# Patient Record
Sex: Female | Born: 1950 | Race: White | Hispanic: No | Marital: Married | Smoking: Never smoker
Health system: Southern US, Community
[De-identification: ages and names within clinical notes are randomized; demographics above are authoritative.]

## PROBLEM LIST (undated history)

## (undated) HISTORY — PX: APPENDECTOMY: SHX54

## (undated) HISTORY — PX: KNEE ARTHROCENTESIS: SUR44

## (undated) HISTORY — PX: TONSILLECTOMY: SUR1361

---
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
## 1998-09-13 ENCOUNTER — Encounter: Payer: Self-pay | Admitting: Orthopedic Surgery

## 1998-09-13 ENCOUNTER — Ambulatory Visit (HOSPITAL_COMMUNITY): Admission: RE | Admit: 1998-09-13 | Discharge: 1998-09-13 | Payer: Self-pay | Admitting: Orthopedic Surgery

## 2000-01-31 ENCOUNTER — Encounter: Payer: Self-pay | Admitting: Obstetrics and Gynecology

## 2000-01-31 ENCOUNTER — Encounter: Admission: RE | Admit: 2000-01-31 | Discharge: 2000-01-31 | Payer: Self-pay | Admitting: Obstetrics and Gynecology

## 2000-10-29 ENCOUNTER — Other Ambulatory Visit: Admission: RE | Admit: 2000-10-29 | Discharge: 2000-10-29 | Payer: Self-pay | Admitting: Obstetrics and Gynecology

## 2001-02-27 ENCOUNTER — Encounter: Payer: Self-pay | Admitting: Cardiovascular Disease

## 2001-02-27 ENCOUNTER — Inpatient Hospital Stay (HOSPITAL_COMMUNITY): Admission: EM | Admit: 2001-02-27 | Discharge: 2001-02-28 | Payer: Self-pay | Admitting: Emergency Medicine

## 2001-02-28 ENCOUNTER — Encounter: Payer: Self-pay | Admitting: Cardiovascular Disease

## 2001-03-05 ENCOUNTER — Encounter: Payer: Self-pay | Admitting: Family Medicine

## 2001-03-05 ENCOUNTER — Encounter: Admission: RE | Admit: 2001-03-05 | Discharge: 2001-03-05 | Payer: Self-pay | Admitting: Family Medicine

## 2001-03-12 ENCOUNTER — Encounter: Admission: RE | Admit: 2001-03-12 | Discharge: 2001-03-12 | Payer: Self-pay | Admitting: Urology

## 2001-03-12 ENCOUNTER — Encounter: Payer: Self-pay | Admitting: Urology

## 2001-03-19 ENCOUNTER — Encounter: Payer: Self-pay | Admitting: Family Medicine

## 2001-03-19 ENCOUNTER — Encounter: Admission: RE | Admit: 2001-03-19 | Discharge: 2001-03-19 | Payer: Self-pay | Admitting: Family Medicine

## 2001-04-23 ENCOUNTER — Ambulatory Visit (HOSPITAL_COMMUNITY): Admission: RE | Admit: 2001-04-23 | Discharge: 2001-04-23 | Payer: Self-pay | Admitting: Gastroenterology

## 2001-04-23 ENCOUNTER — Encounter (INDEPENDENT_AMBULATORY_CARE_PROVIDER_SITE_OTHER): Payer: Self-pay | Admitting: Specialist

## 2001-09-11 ENCOUNTER — Ambulatory Visit (HOSPITAL_COMMUNITY): Admission: RE | Admit: 2001-09-11 | Discharge: 2001-09-11 | Payer: Self-pay | Admitting: Family Medicine

## 2001-09-11 ENCOUNTER — Encounter: Payer: Self-pay | Admitting: Family Medicine

## 2002-01-20 ENCOUNTER — Encounter: Admission: RE | Admit: 2002-01-20 | Discharge: 2002-01-20 | Payer: Self-pay | Admitting: Sports Medicine

## 2002-02-10 ENCOUNTER — Encounter: Admission: RE | Admit: 2002-02-10 | Discharge: 2002-02-10 | Payer: Self-pay | Admitting: Sports Medicine

## 2002-02-10 ENCOUNTER — Encounter: Payer: Self-pay | Admitting: Sports Medicine

## 2002-02-17 ENCOUNTER — Encounter: Admission: RE | Admit: 2002-02-17 | Discharge: 2002-02-17 | Payer: Self-pay | Admitting: Family Medicine

## 2002-03-27 ENCOUNTER — Encounter: Admission: RE | Admit: 2002-03-27 | Discharge: 2002-03-27 | Payer: Self-pay | Admitting: Family Medicine

## 2002-03-31 ENCOUNTER — Encounter: Admission: RE | Admit: 2002-03-31 | Discharge: 2002-03-31 | Payer: Self-pay | Admitting: Sports Medicine

## 2002-04-21 ENCOUNTER — Encounter: Admission: RE | Admit: 2002-04-21 | Discharge: 2002-04-21 | Payer: Self-pay | Admitting: Sports Medicine

## 2002-08-04 ENCOUNTER — Encounter: Admission: RE | Admit: 2002-08-04 | Discharge: 2002-08-04 | Payer: Self-pay | Admitting: Sports Medicine

## 2003-06-06 ENCOUNTER — Encounter: Payer: Self-pay | Admitting: Rheumatology

## 2003-06-06 ENCOUNTER — Encounter: Admission: RE | Admit: 2003-06-06 | Discharge: 2003-06-06 | Payer: Self-pay | Admitting: Rheumatology

## 2005-03-28 ENCOUNTER — Ambulatory Visit: Payer: Self-pay

## 2005-05-13 ENCOUNTER — Inpatient Hospital Stay (HOSPITAL_COMMUNITY): Admission: EM | Admit: 2005-05-13 | Discharge: 2005-05-15 | Payer: Self-pay | Admitting: Emergency Medicine

## 2005-07-11 ENCOUNTER — Ambulatory Visit: Payer: Self-pay

## 2007-11-24 ENCOUNTER — Ambulatory Visit (HOSPITAL_COMMUNITY): Admission: RE | Admit: 2007-11-24 | Discharge: 2007-11-24 | Payer: Self-pay | Admitting: Endocrinology

## 2007-12-27 ENCOUNTER — Emergency Department (HOSPITAL_COMMUNITY): Admission: EM | Admit: 2007-12-27 | Discharge: 2007-12-27 | Payer: Self-pay | Admitting: Emergency Medicine

## 2008-04-07 ENCOUNTER — Ambulatory Visit (HOSPITAL_BASED_OUTPATIENT_CLINIC_OR_DEPARTMENT_OTHER): Admission: RE | Admit: 2008-04-07 | Discharge: 2008-04-07 | Payer: Self-pay | Admitting: Family Medicine

## 2008-04-17 ENCOUNTER — Ambulatory Visit: Payer: Self-pay | Admitting: Internal Medicine

## 2009-08-26 ENCOUNTER — Encounter: Admission: RE | Admit: 2009-08-26 | Discharge: 2009-08-26 | Payer: Self-pay | Admitting: Orthopedic Surgery

## 2009-09-07 ENCOUNTER — Encounter: Admission: RE | Admit: 2009-09-07 | Discharge: 2009-09-07 | Payer: Self-pay | Admitting: Orthopedic Surgery

## 2009-10-04 ENCOUNTER — Encounter: Payer: Self-pay | Admitting: Orthopedic Surgery

## 2009-10-09 ENCOUNTER — Emergency Department (HOSPITAL_COMMUNITY): Admission: EM | Admit: 2009-10-09 | Discharge: 2009-10-09 | Payer: Self-pay | Admitting: Emergency Medicine

## 2010-01-12 ENCOUNTER — Encounter: Admission: RE | Admit: 2010-01-12 | Discharge: 2010-01-12 | Payer: Self-pay | Admitting: Orthopedic Surgery

## 2010-01-13 ENCOUNTER — Encounter: Admission: RE | Admit: 2010-01-13 | Discharge: 2010-01-13 | Payer: Self-pay | Admitting: Orthopedic Surgery

## 2010-07-07 ENCOUNTER — Emergency Department (HOSPITAL_COMMUNITY): Admission: EM | Admit: 2010-07-07 | Discharge: 2010-07-07 | Payer: Self-pay | Admitting: Emergency Medicine

## 2010-07-17 ENCOUNTER — Ambulatory Visit (HOSPITAL_COMMUNITY): Admission: RE | Admit: 2010-07-17 | Discharge: 2010-07-17 | Payer: Self-pay | Admitting: Family Medicine

## 2010-12-10 ENCOUNTER — Encounter: Payer: Self-pay | Admitting: Orthopedic Surgery

## 2011-02-02 LAB — URINE MICROSCOPIC-ADD ON

## 2011-02-02 LAB — DIFFERENTIAL
Basophils Absolute: 0 10*3/uL (ref 0.0–0.1)
Basophils Relative: 0 % (ref 0–1)
Eosinophils Absolute: 0.1 10*3/uL (ref 0.0–0.7)
Eosinophils Relative: 1 % (ref 0–5)
Lymphocytes Relative: 20 % (ref 12–46)
Lymphs Abs: 2.5 10*3/uL (ref 0.7–4.0)
Neutro Abs: 9.1 10*3/uL — ABNORMAL HIGH (ref 1.7–7.7)

## 2011-02-02 LAB — CULTURE, BLOOD (ROUTINE X 2)

## 2011-02-02 LAB — CBC
HCT: 35.3 % — ABNORMAL LOW (ref 36.0–46.0)
Hemoglobin: 12.2 g/dL (ref 12.0–15.0)
MCHC: 34.5 g/dL (ref 30.0–36.0)
MCV: 87.4 fL (ref 78.0–100.0)
Platelets: 280 10*3/uL (ref 150–400)
RDW: 14 % (ref 11.5–15.5)
WBC: 12.4 10*3/uL — ABNORMAL HIGH (ref 4.0–10.5)

## 2011-02-02 LAB — URINE CULTURE: Culture  Setup Time: 201108191605

## 2011-02-02 LAB — BASIC METABOLIC PANEL: Creatinine, Ser: 1 mg/dL (ref 0.4–1.2)

## 2011-02-02 LAB — URINALYSIS, ROUTINE W REFLEX MICROSCOPIC
Glucose, UA: NEGATIVE mg/dL
Hgb urine dipstick: NEGATIVE
Nitrite: NEGATIVE
pH: 7 (ref 5.0–8.0)

## 2011-02-02 LAB — LACTIC ACID, PLASMA: Lactic Acid, Venous: 1.5 mmol/L (ref 0.5–2.2)

## 2011-02-21 LAB — URINALYSIS, ROUTINE W REFLEX MICROSCOPIC
Bilirubin Urine: NEGATIVE
Glucose, UA: NEGATIVE mg/dL
Hgb urine dipstick: NEGATIVE
Ketones, ur: NEGATIVE mg/dL
Protein, ur: NEGATIVE mg/dL

## 2011-02-21 LAB — BASIC METABOLIC PANEL
BUN: 31 mg/dL — ABNORMAL HIGH (ref 6–23)
GFR calc Af Amer: >60 mL/min (ref >60)

## 2011-02-21 LAB — SODIUM, URINE, RANDOM: Sodium, Ur: 29 mEq/L

## 2011-02-21 LAB — PHOSPHORUS: Phosphorus: 5 mg/dL — ABNORMAL HIGH (ref 2.3–4.6)

## 2011-04-03 NOTE — Procedures (Signed)
NAME:  [name], FENN                 ACCOUNT NO.:  [id number]   MEDICAL RECORD NO.:  [id number]          PATIENT TYPE:  OUT   LOCATION:  SLEEP CENTER                 FACILITY:  [facility]   PHYSICIAN:  [name], MD, FCCP, FACPDATE OF BIRTH:  Apr 24[birth year] 1953   DATE OF STUDY:  04/07/2008                            NOCTURNAL POLYSOMNOGRAM   REFERRING PHYSICIAN:   REFERRING PHYSICIAN:  Gloriajean Dell. Andrey Campanile, M.D.   INDICATION FOR STUDY:  Insomnia with sleep apnea.   EPWORTH SLEEPINESS SCORE:  7/24.  BMI 29.8.  Weight 190 pounds.  Height  67 inches.  Neck 14-1/2 inches.   HOME MEDICATIONS:  Charted and reviewed.   MEDICAL HISTORY:  Hypopituitarism noted.   SLEEP ARCHITECTURE:  Total sleep time 341 minutes with sleep efficiency  90.7%.  Stage 1 was 1.8%.  Stage 2 88.9%.  Stage 3 absent.  REM 9.4% of  total sleep time.  Sleep latency 24 minutes.  REM latency 230 minutes.  Awake after sleep onset 11.5 minutes.  Arousal index 7.6.  Bedtime  medication included Neurontin, diazepam, Vicodin, OxyContin, Lunesta and  promethazine, all taken at 10 p.m.   RESPIRATORY DATA:  Apnea hypopnea index (AHI) 5.1 per hour indicating  mild sleep apnea syndrome.  There were 29 scored events including 9  central apneas and 20 hypopneas.  All events were recorded while  nonsupine with REM/AHI 28.1.  There were insufficient events to permit  CPAP titration by split protocol on this study night.   OXYGEN DATA:  Moderately loud snoring with oxygen desaturation to a  nadir of 83%.  Mean oxygen saturation through the study was 93.2% on  room air.   CARDIAC DATA:  Normal sinus rhythm.   MOVEMENT-PARASOMNIA:  No significant movement disturbance.  No bathroom  trips.   IMPRESSIONS-RECOMMENDATIONS:  1. Sleep architecture reflected heavy bedtime medication as noted      above, including multiple narcotics and sleep medications.  2. Mild sleep apnea, mainly central events at hypopneas, AHI 5.1 per      hour with most  events while nonsupine or in REM.  Moderately loud      snoring without with oxygen desaturation to a nadir of 83%.  A      total of 0.5 minutes was spent with oxygen saturation less than 88%      on this study night.  3. CPAP would ordinarily not be considered a therapeutic option for      this pattern.  Consider if the amount of sedating medication at      bedtime can be reduced noting that this patient did sleep through      the night.      [name], MD, [facility], FACP  Diplomate, Biomedical engineer of Sleep Medicine  Electronically Signed     CDY/MEDQ  D:  04/17/2008 12:15:11  T:  04/17/2008 12:51:58  Job:  [id number]

## 2011-04-06 NOTE — Discharge Summary (Signed)
[name], [name]                 ACCOUNT NO.:  [id number]   MEDICAL RECORD NO.:  [id number]          PATIENT TYPE:  INP   LOCATION:  1618                         FACILITY:  [facility]   PHYSICIAN:  Danae Chen, M.D.DATE OF BIRTH:  02-25-1951   DATE OF ADMISSION:  05/13/2005  DATE OF DISCHARGE:  05/15/2005                                 DISCHARGE SUMMARY   DISCHARGE DIAGNOSES:  1.  Nausea, vomiting, abdominal pain secondary to probable viral      gastroenteritis.  2.  Chronic pain syndrome.  3.  History of ADHD.  4.  History of seasonal allergies.   DISCHARGE MEDICATIONS:  The patient is to resume her home medications at  prior dosages.   1.  OxyContin 20 mg p.o. b.i.d.  2.  Ultram 50 mg, 1-2 p.o. q.4-6 h p.r.n.  3.  Lyrica 150 mg p.o. q.h.s.  4.  Cymbalta 30 mg p.o. q.h.s.  5.  Adderall 20 mg, 1-2 p.o. q.6 h p.r.n.  6.  Vivelle 0.5 mg 2 patches per week.  7.  Valium 5 mg p.o. t.i.d. p.r.n.  8.  Zyrtec 10 mg p.o. daily p.r.n.  9.  __________ 0.25 mg q.h.s.  10. Phenergan 25 mg, 1 p.o. q.4-6 h p.r.n.   FOLLOW UP:  Dr. Benedetto Goad, [facility], in 1-2 weeks. The  patient will call for appointment.   CONSULTATIONS:  None.   PROCEDURE:  None.   BRIEF HOSPITAL COURSE:  The patient is a 60 year old white female who was  admitted with nausea, vomiting, and abdominal pain. She underwent  evaluation, KUB was normal. Lab workups included a C Dif toxin which was  also negative. She had no electrolyte abnormalities. By hospital day #2, she  was eating and drinking well without complaints of abdominal pain. She  remained afebrile throughout her hospital stay. She did require some  antiemetics and her home medications while in the hospital but no flareup of  her abdominal pain at the time of discharge.   PHYSICAL EXAMINATION:  VITAL SIGNS:  Temperature 97.8, blood pressure  102/64, pulse 62, 98% saturations on room air.  ABDOMEN:  Soft, nontender, no rebound,  no guarding.  LUNGS:  Clear.  HEART:  Regular rate.  EXTREMITIES:  No peripheral edema.  GENERAL:  Alert and oriented.   LABORATORY DATA:  Pertinent discharge labs as noted. BNP within normal  limits, KUB that showed nothing acute. Hemoglobin A1c was 6.2 and was  checked because of her elevated CBG that may have been related to stress.  Hemoglobin is 14.5. Sodium 142, potassium 3.4, chloride 113, glucose 107,  BUN 8, creatinine 0.9. Lipase was normal at 22.   The patient will be given one dose of K-Dur 40 mEq prior to discharge.       RLK/MEDQ  D:  05/15/2005  T:  05/15/2005  Job:  [id number]

## 2011-04-06 NOTE — Consult Note (Signed)
[facility]  Patient:    [name], [name]                        MRN: [id number] Proc. Date: 02/27/01 Adm. Date:  [id number] Attending:  Ruta Hinds CC:         Richard A. Alanda Amass, M.D.  Carolyne Fiscal, M.D.   Consultation Report  REASON FOR CONSULTATION:  Abdominal pain.  HISTORY OF PRESENT ILLNESS:  For the past two months [name] has had mild fleeting right lower quadrant abdominal pain. Six days ago this developed rather acutely while at work. 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 She has been pretty much in bed for the past week with her persistent right lower quadrant pain. It is described as sharp, intense, burning, searing in nature. Reminiscent of a remote history of endometriosis for which TAH/BSO was performed in 1995. Symptoms have gradually progressed to the point where she was admitted to the hospital via the emergency room tonight.  At the onset she was seen by Dr. Duaine Dredge who prescribed Flagyl and Cipro. She took this for less than 48 hours because of subsequent perioral paresthesias. Antibiotics did offer some relief but symptoms rapidly recurred and worsened once these were discontinued. Despite this, she denies fever. No rigor, chill or diaphoresis. No prior history of diverticular disease.  Her appetite has been rather poor due to pain. She denies nausea or vomiting. No abdominal distention. No increased flatus or belching. Bowel habits have remained regular. There is no correlation of symptoms with defecation. No correlation with urinary function. The patient denies hematemesis, melena or hematochezia.  The patient was evaluated by Dr. Eliberto Ivory. Dickstein today with a normal pelvic exam. Referred to the emergency room for surgical consultation.  PAST MEDICAL/SURGICAL HISTORY: 1. Campylobacter colitis, Salmonella colitis. 2. Status post  TAH/BSO. 3. Status post appendectomy. 4. Colonoscopy in 1995 performed by Dr. Lina Sar because of patients    mothers history of colon cancer. Endometriosis diagnosed at that    time. 5. Degenerative arthritis of both knees, right greater than left. 6. Multiple prior surgeries. 7. Post surgical neuropathy.  CURRENT MEDICATIONS: 1. Neurontin. 2. Ultram. 3. Adderall. 4. Vivelle-Dot.  ALLERGIES:  No known drug allergies.  FAMILY HISTORY:  Mother with history of colon cancer.  SOCIAL HISTORY:  The patient is married. She works as an Neurosurgeon, Therapist, occupational.  PHYSICAL EXAMINATION:  GENERAL:  Acutely ill-appearing, anxious, white female, middle aged, alert and oriented x 3.  VITAL SIGNS:  Vital signs stable, afebrile.  HEENT:  EOMI. PERRL. Anicteric sclerae. Pink conjunctivae. No pallor. Mouth - dry mucosa, otherwise without lesions of lips, teeth or gums.  NECK:  Supple, no adenopathy, thyromegaly or bruit.  LUNGS:  Chest clear to auscultation.  CARDIOVASCULAR:  Regular rhythm, no gallop, no murmur.  ABDOMEN:  Soft, nondistended, tender right lower quadrant with localized peritoneal signs just above the right inguinal ligament. No fullness, no mass, no rebound. Voluntary guarding present. No organomegaly.  RECTAL:  Not performed due to patients location in the ER hallway.  EXTREMITIES:  No cyanosis, clubbing, or edema.  SKIN:  No lesions.  LABORATORY AND ACCESSORY DATA:  Normal CBC, CMET and urinalysis.  Radiographs:  CT scan unenhanced, normal.  ASSESSMENT: 1. Right lower quadrant pain. Impressive to physical examination    but without other objective findings. No fever, leukocytosis or  other evidence for an acute infection. Appendicitis does not have    to concern [place] given her prior history of appendectomy. Similarly,    gynecologic pathology is not a concern here. I suspect the usual    diagnosis such as infarcted epiploic appendix, mesenteric  adenitis.    Doubt primary colopathy. Doubt significant gynecologic pathology.    The patient does not have a surgical abdomen presently. 2. Left costovertebral angle tenderness, uncertain etiology. I am    not sure how this relates to the patients right lower quadrant pain. 3. Increased colorectal neoplasia risk. Significant family history with    first degree relative, e.g., mother with colon cancer.  RECOMMENDATIONS: 1. Perform abdominal/pelvic CT following addition of IV and oral    contrast. 2. Monitor stool Hemoccults. 3. Follow physical examination closely for suspect, laparoscopy    may be indicated if symptoms continue without clear diagnosis. 4. I will follow the patient along with Dr. Pearletha Furl. Alanda Amass. DD:  02/27/01 TD:  02/28/01 Job: [id number]

## 2011-04-06 NOTE — Discharge Summary (Signed)
[facility]  Patient:    [name], [name]                        MRN: [id number] Adm. Date:  [id number] Disc. Date: [id number] Attending:  Ruta Hinds Dictator:   Mancel Bale, P.A. CC:         Griffith Citron, M.D.  Carolyne Fiscal, M.D.   Discharge Summary  ADMISSION DIAGNOSES: 1. History of total abdominal hysterectomy and bilateral    salpingo-oophorectomy. 2. History of spastic colon. 3. Right lower quadrant pain of questionable etiology. 4. History of bilateral staged knee surgeries in 1988 and 1999. 5. Remote appendectomy. 6. Irritable bowel syndrome. 7. Mitral valve prolapse.  DISCHARGE DIAGNOSES: 1. History of total abdominal hysterectomy and bilateral    salpingo-oophorectomy. 2. History of spastic colon. 3. Right lower quadrant pain of questionable etiology. 4. History of bilateral staged knee surgeries in 1988 and 1999. 5. Remote appendectomy. 6. Irritable bowel syndrome. 7. Mitral valve prolapse. 8. Status post gastrointestinal evaluation by Griffith Citron, M.D. 9. Status post general surgery consultation by Abigail Miyamoto, M.D., with no    indication to explore groin or abdomen.  HISTORY OF PRESENT ILLNESS:  [name] is a 60 year old white female who was in her general state of health until approximately six days prior to admission, at which time she developed crampy bilateral lower quadrant pain. She had no nausea, vomiting, or fever.  She was seen as an outpatient in primary and was given Flagyl and Cipro.  She developed paresthesias of the lips and stopped her medications.  She had an increased amount of discomfort on Monday and had intermittent right lower quadrant pain since then.  It was worsened on the day of admission and she saw Dr. Cordelia Pen A. Dicksteins P.A. and apparently had a negative pelvic exam.  She was sent to the emergency room to have surgical consultation.  She was felt to be  dehydrated and nauseous, but had no emesis.  She had no fever or chills.  She did have left CVA tenderness.  She was seen and evaluated by Richard A. Alanda Amass, M.D., at that point.  The etiology of her liver and left flank pain was unclear.  Labs were pending at that point.  He planned for GI evaluation and possible surgical consultation.  As well, abdominal CT and labs were ordered.  HOSPITAL COURSE:  On February 28, 2001, she was seen in GI consultation by Griffith Citron, M.D.  At that time, he recommended CT, check stool guaiacs, and follow exam.  It was felt that she may warrant laparoscopy if symptoms continued.  He planned to follow.  Also, on February 27, 2001, she was seen in general surgery consultation by Abigail Miyamoto, M.D.  The exam did not indicate severe intra-abdominal process.  He was awaiting the results of labs and CT and planned to follow. He planned serial abdominal exams and rehydration.  On February 28, 2001, she was seen again by Abigail Miyamoto, M.D., from general surgery.  CT of the abdomen and pelvis with and without contrast showed tiny left renal stone and ______ of kidney.  No ureteral obstruction or hydronephrosis.  No pyelonephritis.  There was questionable stranding around the right kidney on noncontrast CT, now negative on contrast CT.  There was no hernia, free fluid, inflammatory process, obstruction, or hernia.  No free air.  He felt that she had abdominal pain of uncertain  etiology.  Laboratories were normal and CT showed no significant findings.  He felt there was no indication to explore the groin or abdomen at this point.  He planned to continue to follow.  On February 28, 2001, she remained stable and all laboratories and tests remained negative.  She was deemed stable for discharge home at that time.  At that point, she was afebrile at 97.1 degrees, pulse 65, blood pressure 98/50, and oxygen saturation 99% on room air.  Blood cultures showed no  growth.  The TSH was normal, urinalysis normal, and amylase normal.  She was seen by Gerlene Burdock A. Alanda Amass, M.D., and was deemed stable for discharge home.  CONSULTS: 1. GI consultation by Griffith Citron, M.D., on February 28, 2001, for    evaluation of abdominal pain. 2. General surgery consultation on February 27, 2001, by Abigail Miyamoto, M.D.,    for evaluation of lower abdominal pain.  HOSPITAL PROCEDURES:  None.  RADIOLOGY:  CT on February 28, 2001, of the abdomen and pelvis showed left renal calculi without findings of obstruction.  Question slight haziness of fat planes in the region of the Morrisons pouch.  CT of the pelvis showed no evidence of abnormal inflammatory process in pelvis and small lucency of right ileum of questionable significance.  CT of the pelvis showed no evidence of obstruction and no evidence of acute abnormality.  CT of the abdomen with contrast showed ______ density at left lung base, question atelectasis. Stability can be concluded on follow-up exam.  Left renal calculi without evidence of obstruction.  The region of Morrisons pouch appeared clear. Gallbladder prominent in size may be related to fat.  The patient had not eaten recently.  If gallstones were of clinical concern, ultrasound will be planned.  EKG showed sinus bradycardia at 56 beats per minute and nonspecific ST-T change.  LABORATORY DATA:  On February 27, 2001, WBC 5.9, hemoglobin 12.2, hematocrit 35.9, and platelets 325.  ESR 3.  Sodium 138, potassium 3.4, glucose 95, BUN 8, creatinine 0.9, AST 19, ALT 16, ALP 29, total bilirubin 1.1, direct bilirubin 0.1, indirect bilirubin 1.0, amylase 40. TSH 1.889.  Amylase 71.  The urinalysis was negative.  Blood cultures negative.  ANA negative.  DISCHARGE MEDICATIONS:  She was discharged home on her previous medications with no changes.  FOLLOW-UP:  She is to follow up with Griffith Citron, M.D., and Pearletha Furl.  Alanda Amass, M.D., as instructed.   She is to follow up with them as scheduled routine for office visits. DD:  03/31/01 TD:  04/01/01 Job: [id number]

## 2011-04-06 NOTE — H&P (Signed)
[name], PINCOCK                 ACCOUNT NO.:  [id number]   MEDICAL RECORD NO.:  [id number]          PATIENT TYPE:  EMS   LOCATION:  ED                           FACILITY:  [facility]   PHYSICIAN:  Danae Chen, M.D.DATE OF BIRTH:  10/15/1951   DATE OF ADMISSION:  05/13/2005  DATE OF DISCHARGE:                                HISTORY & PHYSICAL   PRIMARY CARE PHYSICIAN:  Dr. Duffy Rhody C. Wilson.   CHIEF COMPLAINT:  Nausea, vomiting and diarrhea.   HISTORY OF PRESENT ILLNESS:  The patient is a 60 year old white female with  a history of chronic pain syndrome with anxiety who presents with a 3-day  history of upset stomach, nausea, vomiting and diarrhea.  She said that her  family has left for [facility] on a trip and she was too weak to take care of  herself, so she was brought to the emergency room for further evaluation.  She denies any bloody stool.  No recent travel.  No foods.  No unusual foods  that she is aware of that she has eaten.  She denies any fever.  No  headache, no chest pain, no shortness of breath.  She has had no ill  contacts, either in the home or outside the home with similar symptoms.  She  has not been on any recent antibiotics that she is aware of.   PAST MEDICAL HISTORY:  Significant for chronic pain syndrome, constipation,  irritable bowel, depression, anxiety, ADHD.   PAST SURGICAL HISTORY:  Status post hysterectomy and appendectomy, knee  surgery.   MEDICATIONS:  1.  Oxycodone.  2.  Zyrtec.  3.  Ambien.  4.  Diazepam.  5.  Ultram.  6.  Adderall.  7.  Colace.   She has no drug allergies she is aware of.   REVIEW OF SYSTEMS:  Per the history of present illness.   SOCIAL HISTORY:  Denies any tobacco, alcohol or drug use.  She lives with  family and children.  She does not work outside the home.   FAMILY HISTORY:  Negative for colon cancer, hypertension, diabetes.   PHYSICAL EXAMINATION:  VITAL SIGNS:  Temperature is 98.8, blood pressure  140/80, pulse 81.  O2 saturation is 97% on room air.  HEENT:  The oropharynx is clear.  Buccal mucosal membranes are moist.  Head  is atraumatic, normocephalic.  Pupils are equal and reactive.  NECK:  Supple.  No lymphadenopathy.  No JVD.  LUNGS:  Clear to auscultation bilaterally.  HEART:  Rate is regular with a normal S1 and S2.  No murmurs are  appreciated.  ABDOMEN:  Soft, nontender.  No rebound, no guarding, no fluid wave.  No  suprapubic tenderness.  No costovertebral angle tenderness.  EXTREMITIES:  Two plus femoral pulses.  Two plus dorsalis pedis pulses.  She  has no peripheral edema.  Extremities are warm.  NEUROLOGIC:  A nonfocal neurologic exam.  Moves all four extremities.  She  is alert and oriented.   PERTINENT LABS:  White count 8.6 thousand, hemoglobin 14.5, platelets 373.  Sodium 137, potassium 3.5, chloride  107, CO2 22, glucose 157, BUN 15,  creatinine 1.  Calcium 9.6.  Albumin is 4.4.  Liver function tests are  within normal limits.  Lipase is normal at 22.   The patient has received Zofran in the ED.  She has stool for C. difficile,  stool culture and stool O&P that are on order here in the ED.  She has also  had two Lomotil as well as Protonix here in the ED.   IMPRESSION:  A 60 year old white female with chronic pain syndrome who now  presents with nausea, vomiting, diarrhea, intractable to outpatient therapy  for about 3 days' duration with no ill contacts.  The patient presents with  no electrolyte abnormalities, does not appear to be dehydrated, has no focal  physical symptoms.  The differential diagnosis may include viral  gastroenteritis with or without stomach flu and/or reaction to her chronic  pain medications.   At this time we will admit her for observation, IV fluid hydration and will  follow up with her lab results that have been previously ordered.  We will  check a KUB as well.  If further workup does not reveal a specific source of  an  infectious etiology, this may be functional related and/or related to her  medications.  Further disposition is dependent on current evaluation.       RLK/MEDQ  D:  05/13/2005  T:  05/13/2005  Job:  [id number]

## 2011-08-10 LAB — COMPREHENSIVE METABOLIC PANEL
ALT: 57 — ABNORMAL HIGH
AST: 41 — ABNORMAL HIGH
Alkaline Phosphatase: 97
CO2: 32
Chloride: 100
Creatinine, Ser: 0.81
GFR calc Af Amer: >60
GFR calc non Af Amer: >60
Total Bilirubin: 0.5

## 2011-08-10 LAB — URINALYSIS, ROUTINE W REFLEX MICROSCOPIC
Bilirubin Urine: NEGATIVE
Glucose, UA: NEGATIVE
Hgb urine dipstick: NEGATIVE
Ketones, ur: NEGATIVE
Nitrite: NEGATIVE
Protein, ur: NEGATIVE
Specific Gravity, Urine: 1.012
Urobilinogen, UA: 0.2
pH: 5.5

## 2011-08-10 LAB — CBC
MCV: 84.4
RBC: 4.07
WBC: 7.9

## 2011-08-10 LAB — DIFFERENTIAL
Basophils Absolute: 0
Basophils Relative: 0
Eosinophils Absolute: 0
Eosinophils Relative: 0
Lymphocytes Relative: 25

## 2011-08-10 LAB — PROTIME-INR: Prothrombin Time: 12.6

## 2011-09-13 DIAGNOSIS — E230 Hypopituitarism: Secondary | ICD-10-CM | POA: Insufficient documentation

## 2012-08-08 IMAGING — CT CT CHEST W/O CM
2 of 4 series · 15 of 36 positions shown, 18 images · non-contrast
Comparison: Chest x-ray 07/07/2010

CLINICAL DATA: Possible right lower lobe nodule.  Shortness of
breath.

CT CHEST WITHOUT CONTRAST
TECHNIQUE: Multidetector CT imaging of the chest was performed
following the standard protocol without IV contrast.

[Series 2: chest w/o st · axial · non-contrast · 0.74mm/px · z∈[-462,-208]mm · 12 of 61 slices shown, 15 images]
[im 5/61  mediastinal]
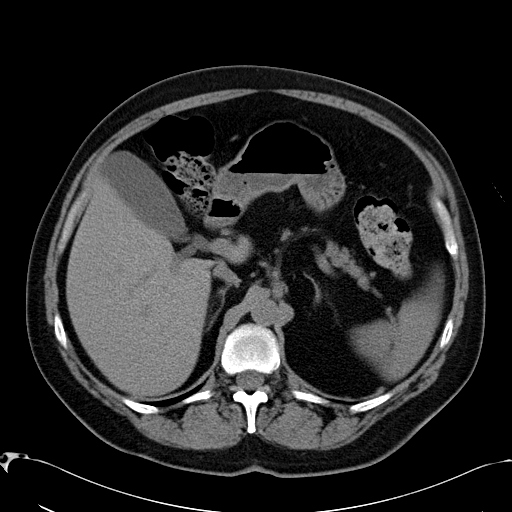
[im 5/61  lung]
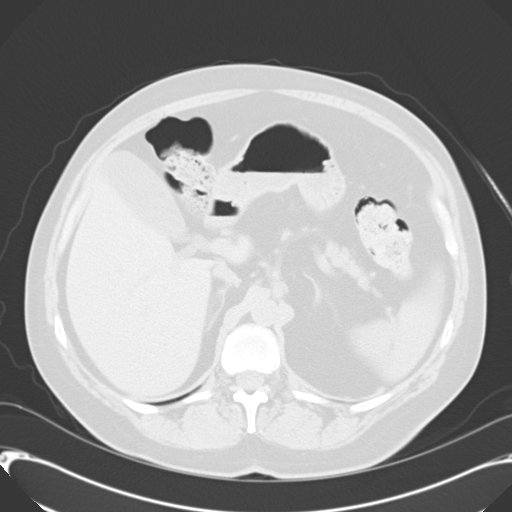
[im 10/61  lung]
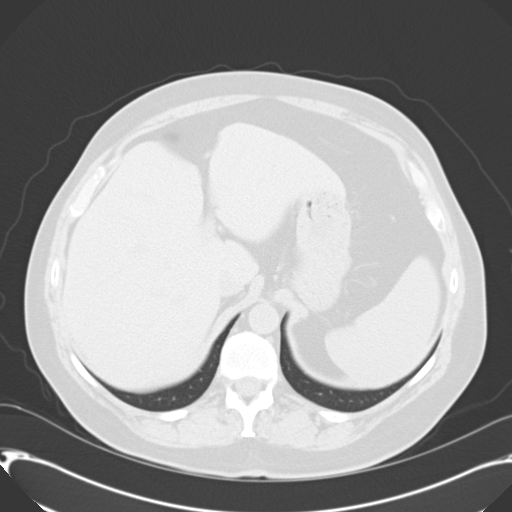
[im 14/61  lung]
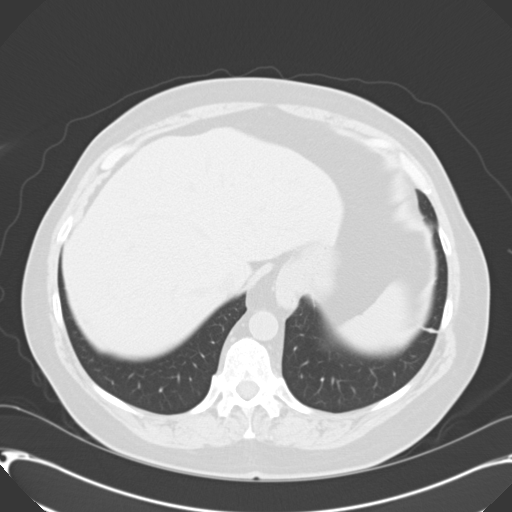
[im 19/61  lung]
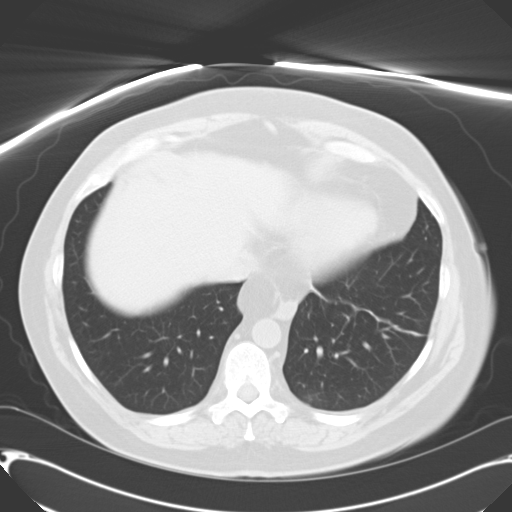
[im 24/61  mediastinal]
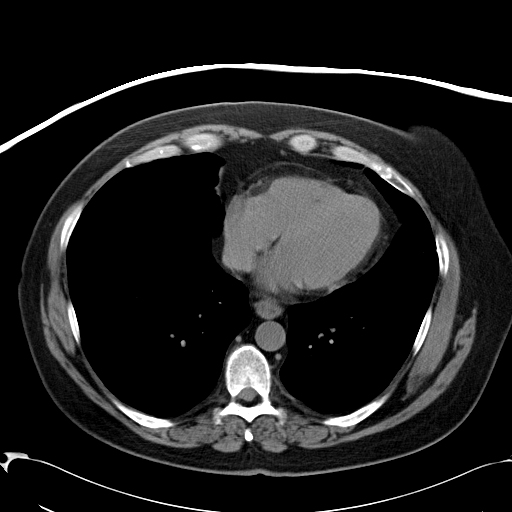
[im 24/61  lung]
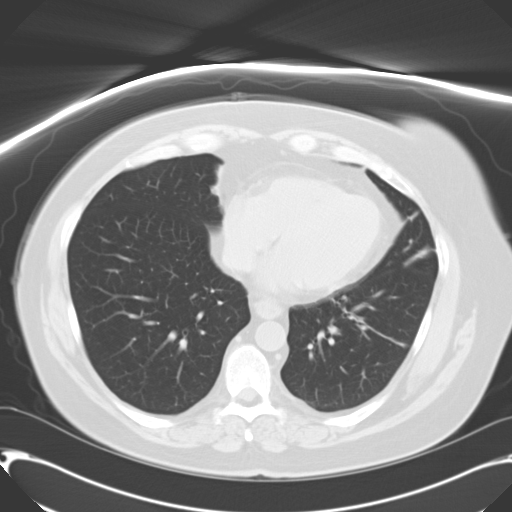
[im 28/61  lung]
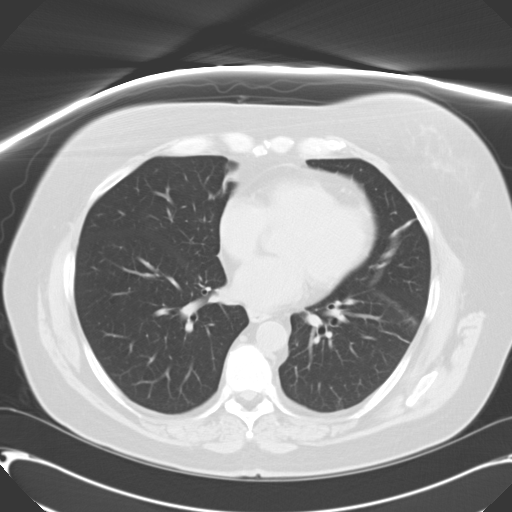
[im 33/61  lung]
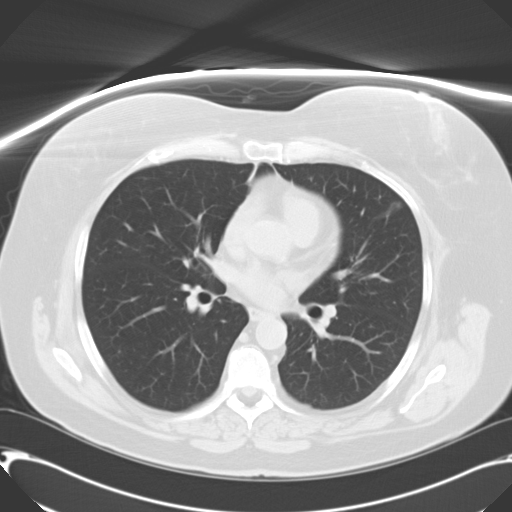
[im 37/61  lung]
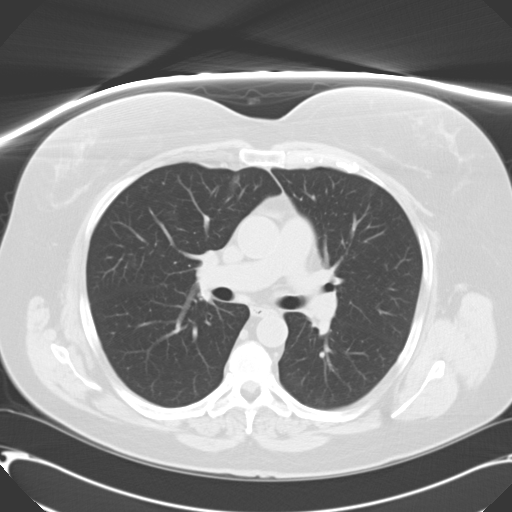
[im 42/61  mediastinal]
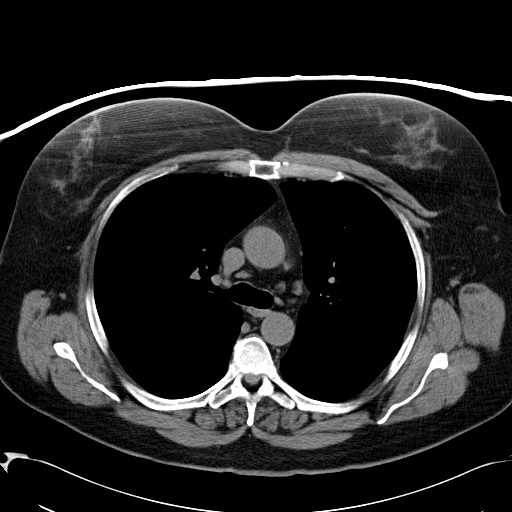
[im 42/61  lung]
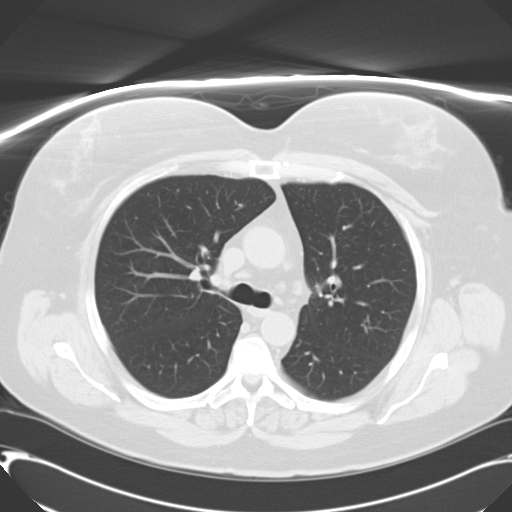
[im 47/61  lung]
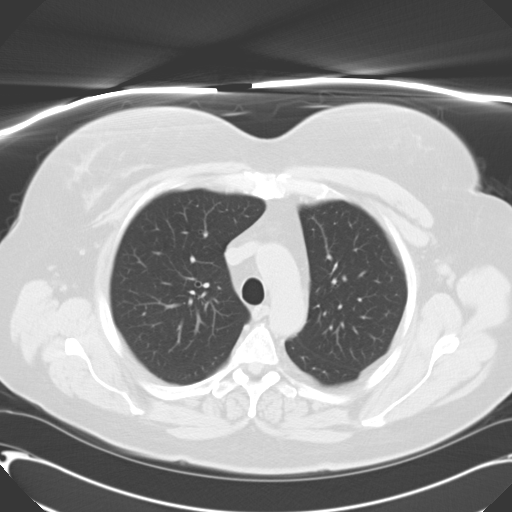
[im 51/61  lung]
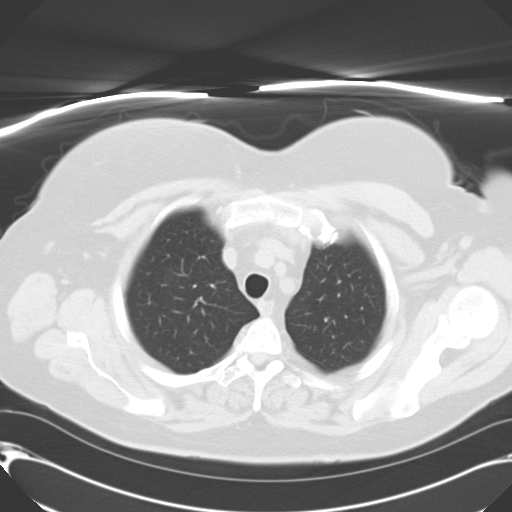
[im 56/61  lung]
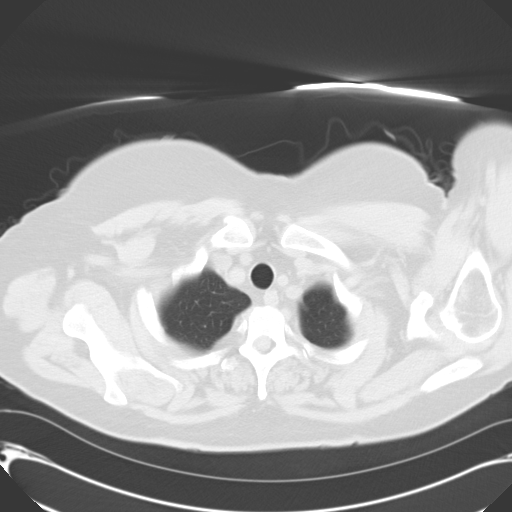

[Series 602: <mpr thick range> · coronal · 0.74mm/px · 3 of 86 slices shown]
[im 18/86  lung]
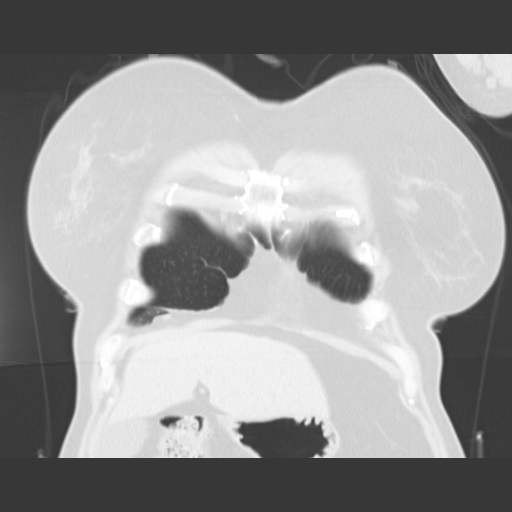
[im 35/86  lung]
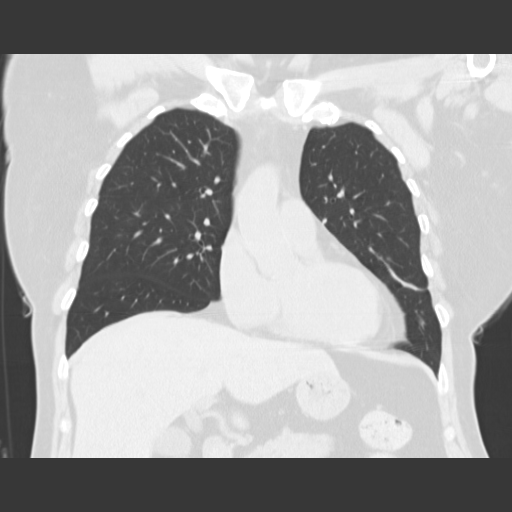
[im 52/86  lung]
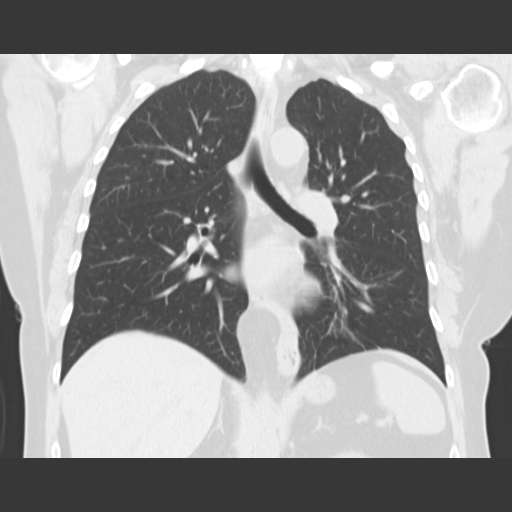

[15 of 36 positions shown; findings below may reference images not displayed]

FINDINGS: No pathologically enlarged mediastinal or axillary lymph
nodes.  There are calcified right hilar lymph nodes.  Heart size
normal.  No pericardial effusion. Small hiatal hernia.

Scarring is seen in the lingula, right middle lobe and left lower
lobe.  Small calcified granuloma in the right lower lobe.  Lungs
are otherwise clear.  No pleural fluid.  Airway is unremarkable.

Incidental imaging of the upper abdomen shows no acute findings.
No worrisome lytic or sclerotic lesions.
IMPRESSION: No acute findings.  No findings to explain the questioned
abnormality on recent chest x-ray.

## 2013-04-25 DIAGNOSIS — G894 Chronic pain syndrome: Secondary | ICD-10-CM | POA: Insufficient documentation

## 2013-07-01 DIAGNOSIS — M461 Sacroiliitis, not elsewhere classified: Secondary | ICD-10-CM | POA: Insufficient documentation

## 2013-07-01 DIAGNOSIS — G609 Hereditary and idiopathic neuropathy, unspecified: Secondary | ICD-10-CM | POA: Insufficient documentation

## 2014-12-29 DIAGNOSIS — M2550 Pain in unspecified joint: Secondary | ICD-10-CM | POA: Insufficient documentation

## 2016-01-19 DIAGNOSIS — G5640 Causalgia of unspecified upper limb: Secondary | ICD-10-CM | POA: Insufficient documentation

## 2016-01-19 DIAGNOSIS — E2740 Unspecified adrenocortical insufficiency: Secondary | ICD-10-CM | POA: Insufficient documentation

## 2016-01-19 DIAGNOSIS — M150 Primary generalized (osteo)arthritis: Secondary | ICD-10-CM | POA: Insufficient documentation

## 2016-01-19 DIAGNOSIS — R609 Edema, unspecified: Secondary | ICD-10-CM | POA: Insufficient documentation

## 2016-01-19 DIAGNOSIS — G4730 Sleep apnea, unspecified: Secondary | ICD-10-CM | POA: Insufficient documentation

## 2016-01-19 DIAGNOSIS — J984 Other disorders of lung: Secondary | ICD-10-CM | POA: Insufficient documentation

## 2016-01-19 DIAGNOSIS — I10 Essential (primary) hypertension: Secondary | ICD-10-CM | POA: Insufficient documentation

## 2016-01-19 DIAGNOSIS — L821 Other seborrheic keratosis: Secondary | ICD-10-CM | POA: Insufficient documentation

## 2016-01-19 DIAGNOSIS — G4731 Primary central sleep apnea: Secondary | ICD-10-CM | POA: Insufficient documentation

## 2016-01-19 DIAGNOSIS — K589 Irritable bowel syndrome without diarrhea: Secondary | ICD-10-CM | POA: Insufficient documentation

## 2016-01-19 DIAGNOSIS — E559 Vitamin D deficiency, unspecified: Secondary | ICD-10-CM | POA: Insufficient documentation

## 2016-07-26 DIAGNOSIS — K219 Gastro-esophageal reflux disease without esophagitis: Secondary | ICD-10-CM | POA: Insufficient documentation

## 2016-08-20 ENCOUNTER — Ambulatory Visit: Payer: Self-pay | Admitting: Podiatry

## 2016-08-20 ENCOUNTER — Encounter: Payer: Self-pay | Admitting: Podiatry

## 2016-08-20 ENCOUNTER — Ambulatory Visit (INDEPENDENT_AMBULATORY_CARE_PROVIDER_SITE_OTHER): Payer: Medicaid Other | Admitting: Podiatry

## 2016-08-20 ENCOUNTER — Encounter (INDEPENDENT_AMBULATORY_CARE_PROVIDER_SITE_OTHER): Payer: Self-pay

## 2016-08-20 VITALS — BP 169/83 | HR 87 | Resp 14

## 2016-08-20 DIAGNOSIS — L03031 Cellulitis of right toe: Secondary | ICD-10-CM | POA: Diagnosis not present

## 2016-08-20 DIAGNOSIS — G629 Polyneuropathy, unspecified: Secondary | ICD-10-CM | POA: Diagnosis not present

## 2016-08-20 NOTE — Progress Notes (Signed)
This patient presents the office with chief complaint of an ingrowing toenail on the inside border big toe, right foot. She says it has been present for 5 weeks. Patient states that she has noted redness and swelling associated with the toenail and she has worked on the nail herself. 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 She presents the office today stating that she had surgery performed by myself years ago on the left great toe. She presents the office today for definitive evaluation and treatment of this right great toe   GENERAL APPEARANCE: Alert, conversant. Appropriately groomed. No acute distress.  VASCULAR: Pedal pulses are  palpable at  [facility] and PT bilateral.  Capillary refill time is immediate to all digits,  Normal temperature gradient.    NEUROLOGIC: sensation is absent to 5.07 monofilament at 5/5 sites bilateral.  Light touch is intact bilateral, Muscle strength normal.  MUSCULOSKELETAL: acceptable muscle strength, tone and stability bilateral.  Intrinsic muscluature intact bilateral.  Rectus appearance of foot and digits noted bilateral.   DERMATOLOGIC: skin color, texture, and turgor are within normal limits.  No preulcerative lesions or ulcers  are seen, no interdigital maceration noted.  No open lesions present.   No drainage noted.  NAILS  Marked incurvation with redness and swelling along the medial border right great toe.   Paronychia right hallux.  Ingrown Nail right great toe.   IE  Nail surgery.  Treatment options and alternatives discussed.  Recommended permanent phenol matrixectomy and patient agreed.  Right  was prepped with alcohol and a toe block of 3cc of 2% lidocaine plain was administered in a digital toe block. .  The toe was then prepped with betadine solution .  The offending nail border was then excised and matrix tissue exposed.  Phenol was then applied to the matrix tissue followed by  an alcohol wash.  Antibiotic ointment and a dry sterile dressing was applied.  The patient was dispensed instructions for aftercare.  RTC 1 week.     Helane GuntherGregory Dallana Mavity DPM

## 2016-08-22 DIAGNOSIS — M797 Fibromyalgia: Secondary | ICD-10-CM | POA: Insufficient documentation

## 2016-08-27 ENCOUNTER — Ambulatory Visit: Payer: Medicaid Other | Admitting: Podiatry

## 2016-09-03 DIAGNOSIS — G2581 Restless legs syndrome: Secondary | ICD-10-CM | POA: Insufficient documentation

## 2017-01-21 DIAGNOSIS — M5416 Radiculopathy, lumbar region: Secondary | ICD-10-CM | POA: Insufficient documentation

## 2017-03-22 ENCOUNTER — Ambulatory Visit (INDEPENDENT_AMBULATORY_CARE_PROVIDER_SITE_OTHER): Payer: Medicaid Other | Admitting: Orthopaedic Surgery

## 2017-03-22 ENCOUNTER — Encounter (INDEPENDENT_AMBULATORY_CARE_PROVIDER_SITE_OTHER): Payer: Self-pay | Admitting: Orthopaedic Surgery

## 2017-03-22 VITALS — BP 151/58 | HR 85 | Ht 69.0 in | Wt 185.0 lb

## 2017-03-22 DIAGNOSIS — M25561 Pain in right knee: Secondary | ICD-10-CM | POA: Diagnosis not present

## 2017-03-22 DIAGNOSIS — G8929 Other chronic pain: Secondary | ICD-10-CM

## 2017-03-22 MED ORDER — LIDOCAINE HCL 1 % IJ SOLN
5.0000 mL | INTRAMUSCULAR | Status: AC | PRN
  Administered 2017-03-22: 5 mL

## 2017-03-22 MED ORDER — BUPIVACAINE HCL 0.5 % IJ SOLN
3.0000 mL | INTRAMUSCULAR | Status: AC | PRN
  Administered 2017-03-22: 3 mL via INTRA_ARTICULAR

## 2017-03-22 MED ORDER — METHYLPREDNISOLONE ACETATE 40 MG/ML IJ SUSP
80.0000 mg | INTRAMUSCULAR | Status: AC | PRN
  Administered 2017-03-22: 80 mg

## 2017-03-22 NOTE — Progress Notes (Signed)
Office Visit Note   Patient: [name]           Date of Birth: 06-Dec-1950           MRN: [id number] Visit Date: 03/22/2017              Requested by: Barbie Banner, MD [address] [place], [place] [address] PCP: Pamelia Hoit, MD   Assessment & Plan: Visit Diagnoses:  1. Chronic pain of right knee   Osteoarthritis right knee  Plan: Long discussion over 30 minutes regarding the problem with her right knee and different treatment options. I'd like to obtain an MRI scan for further evaluation given the chronicity of her pain and limited response to cortisone and Visco supplementation. I will also inject her right knee with cortisone today.  Follow-Up Instructions: Return for after MRI right knee.   Orders:  Orders Placed This Encounter  Procedures  . MR Knee Right w/o contrast   No orders of the defined types were placed in this encounter.     Procedures: Large Joint Inj Date/Time: 03/22/2017 12:37 PM Performed by: Valeria Batman Authorized by: Valeria Batman   Consent Given by:  Patient Timeout: prior to procedure the correct patient, procedure, and site was verified   Indications:  Pain and joint swelling Location:  Knee Site:  R knee Prep: patient was prepped and draped in usual sterile fashion   Needle Size:  25 G Needle Length:  1.5 inches Approach:  Anteromedial Ultrasound Guidance: No   Fluoroscopic Guidance: No   Arthrogram: No   Medications:  5 mL lidocaine 1 %; 80 mg methylPREDNISolone acetate 40 MG/ML; 3 mL bupivacaine 0.5 % Aspiration Attempted: No   Patient tolerance:  Patient tolerated the procedure well with no immediate complications     Clinical Data: No additional findings.   Subjective: Chief Complaint  Patient presents with  . Right Knee - Pain    [name] is a 66 y o that presents with Right knee pain, hx of multiple knee surgeries. She relates she also has neuropathy both feet and starting in hands.   Siong  relates a chronic history of problems particularly referable to her right knee but is actually having some pain in both of her lower extremities in multiple joints. She recently has been followed by Dr. Charlett Blake who has performed into full cortisone injections and viscose supplementation. [name] relates that she's had some temporary relief of her pain. Approximately 20 years ago she had bilateral tibial tubercle really positioning by Dr. Eulah Pont. I performed a knee arthroscopy that many years ago. She does have history of chronic pain syndrome is being followed in a pain clinic in [facility]. She takes oxycodone 5 mg up to 4 times a day and OxyContin 60 mg each morning and 40 mg approximately every 8 hours she is on a number of other medicines which we will scan to the chart and also has a number of drug allergies. Other medical problems include hypertension, fibromyalgia, neuritis, severe osteoarthritis of multiple joints. Peripheral neuropathy, chronic pain, irritable bowel syndrome, and sleep apnea. Dr. Charlett Blake felt that she had exhausted all of the conservative treatment for her right knee. HPI  Review of Systems   Objective: Vital Signs: BP (!) 151/58   Pulse 85   Ht 5\' 9"  (1.753 m)   Wt 185 lb (83.9 kg)   BMI 27.32 kg/m   Physical Exam  Ortho Exam [name] was  awake alert and oriented 3 and comfortable in the sitting position. The right knee was not unstable. Mild medial joint pain. No popliteal mass. No effusion. No Calf discomfort. No distal edema. +1 pulses. Painless range of motion of hip. Straight leg raise negative. Full extension over 105 of flexion right knee  Specialty Comments:  No specialty comments available.  Imaging: No results found.   PMFS History: There are no active problems to display for this patient.  No past medical history on file.  No family history on file.  Past Surgical History:  Procedure Laterality Date  . APPENDECTOMY    . KNEE ARTHROCENTESIS     . KNEE ARTHROSCOPY W/ ACL RECONSTRUCTION  1973  . TONSILLECTOMY     Social History   Occupational History  . Not on file.   Social History Main Topics  . Smoking status: Never Smoker  . Smokeless tobacco: Never Used  . Alcohol use No  . Drug use: No  . Sexual activity: Not on file     Valeria BatmanPeter W Elli Groesbeck, MD   Note - This record has been created using [employer] software.  Chart creation errors have been sought, but may not always  have been located. Such creation errors do not reflect on  the standard of medical care.

## 2017-04-16 ENCOUNTER — Telehealth (INDEPENDENT_AMBULATORY_CARE_PROVIDER_SITE_OTHER): Payer: Self-pay | Admitting: Orthopaedic Surgery

## 2017-04-16 NOTE — Telephone Encounter (Signed)
Patient called wanting to get results of her MRI.  She was told that she could get them over the phone since she lives so far apart.  CB#[phone].  She also wants to let him know that both her knees are giving out on her.

## 2017-04-18 NOTE — Telephone Encounter (Signed)
Need results.

## 2017-04-18 NOTE — Telephone Encounter (Signed)
Please call.

## 2017-04-19 NOTE — Telephone Encounter (Signed)
LVMOM for patient that she needs to call the facility where she got the MRI and have results faxed to us.

## 2017-10-25 DIAGNOSIS — G4733 Obstructive sleep apnea (adult) (pediatric): Secondary | ICD-10-CM | POA: Insufficient documentation

## 2019-01-21 ENCOUNTER — Ambulatory Visit (INDEPENDENT_AMBULATORY_CARE_PROVIDER_SITE_OTHER): Payer: Medicare Other | Admitting: Podiatry

## 2019-01-21 ENCOUNTER — Encounter: Payer: Self-pay | Admitting: Podiatry

## 2019-01-21 VITALS — BP 156/89 | HR 80

## 2019-01-21 DIAGNOSIS — L03032 Cellulitis of left toe: Secondary | ICD-10-CM

## 2019-01-21 MED ORDER — CEPHALEXIN 500 MG PO CAPS: 500.0000 mg | ORAL_CAPSULE | Freq: Two times a day (BID) | ORAL | 0 refills | Status: DC

## 2019-01-21 NOTE — Progress Notes (Signed)
This patient presents to the office with chief complaint of a area at the base of the left big toenail that keeps bleeding.   She says she injured this area and the nail on the left great toe has fallen off.  Since that time she has developed what she calls is proud flesh  at the base of the inside border left great toe.  She says she has been soaking this area but it continues to be present and bleed.  She presents the office today for an evaluation and treatment of this bleeding area.    Vascular  Dorsalis pedis and posterior tibial pulses are palpable  B/L.  Capillary return  WNL.  Temperature gradient is  WNL.  Skin turgor  WNL  Sensorium  Senn Weinstein monofilament wire absent  B/L.  Marland Kitchen Normal tactile sensation.  Nail Exam  Patient has normal nails with no evidence of bacterial or fungal infection. Granuloma medial border left nail bed.  Orthopedic  Exam  Muscle tone and muscle strength  WNL.  No limitations of motion feet  B/L.  No crepitus or joint effusion noted.  Foot type is unremarkable and digits show no abnormalities.  Bony prominences are unremarkable.  Skin  No open lesions.  Normal skin texture and turgor.  Paronychia/Granuloma medial border left hallux.  ROV>  Cauterized granuloma.  Prescribed cephalexin  PO.  Told to continue soaks.  RTC prn.   Helane Gunther DPM

## 2020-05-10 DIAGNOSIS — Z87440 Personal history of urinary (tract) infections: Secondary | ICD-10-CM | POA: Insufficient documentation

## 2022-12-24 DIAGNOSIS — H8110 Benign paroxysmal vertigo, unspecified ear: Secondary | ICD-10-CM | POA: Insufficient documentation

## 2022-12-24 DIAGNOSIS — G253 Myoclonus: Secondary | ICD-10-CM | POA: Insufficient documentation

## 2023-08-21 DIAGNOSIS — T85192A Other mechanical complication of implanted electronic neurostimulator (electrode) of spinal cord, initial encounter: Secondary | ICD-10-CM | POA: Insufficient documentation

## 2023-08-29 DIAGNOSIS — E2749 Other adrenocortical insufficiency: Secondary | ICD-10-CM | POA: Insufficient documentation

## 2023-12-24 DIAGNOSIS — R739 Hyperglycemia, unspecified: Secondary | ICD-10-CM | POA: Insufficient documentation

## 2023-12-24 DIAGNOSIS — E782 Mixed hyperlipidemia: Secondary | ICD-10-CM | POA: Insufficient documentation

## 2024-01-16 ENCOUNTER — Ambulatory Visit (INDEPENDENT_AMBULATORY_CARE_PROVIDER_SITE_OTHER): Payer: Medicare Other | Admitting: Podiatry

## 2024-01-16 DIAGNOSIS — B351 Tinea unguium: Secondary | ICD-10-CM

## 2024-01-16 DIAGNOSIS — L600 Ingrowing nail: Secondary | ICD-10-CM

## 2024-01-16 NOTE — Patient Instructions (Signed)
 Place 1/4 cup of epsom salts in a quart of warm tap water.  Submerge your foot or feet in the solution and soak for 20 minutes.  This soak should be done twice a day.  Next, remove your foot or feet from solution, blot dry the affected area. Apply antibiotic ointment and cover with Bandaid.   IF YOUR SKIN BECOMES IRRITATED WHILE USING THESE INSTRUCTIONS, IT IS OKAY TO SWITCH TO  WHITE VINEGAR AND WATER.  As another alternative soak, you may use antibacterial soap and water.  Monitor for any signs/symptoms of infection. Call the office immediately if any occur or go directly to the emergency room. Call with any questions/concerns.

## 2024-01-16 NOTE — Progress Notes (Signed)
 Subjective:  Patient ID: [name], female    DOB: [birth year],  MRN: [id number]  [name] presents to clinic today for:  Chief Complaint  Patient presents with   Ingrown Toenail    Bilateral great toes. She has clipped and pulled them already, bilat borders. The left is open and draining. Possibly infected. Not Diabetic, no anticosg.    Patient has recurrent ingrown toenails to the left great toenail and right great toenail along the medial border.  She recently "ripped out" the medial border of the right hallux on her own.  She would like to have the entire left great toenail removed secondary to pain and thickness and abnormal contour.  PCP is Barbie Banner, MD.  Allergies  Allergen Reactions   Fluoxetine Swelling    Other reaction(s): HALLUCINATIONS Other reaction(s): Swelling Other reaction(s): HALLUCINATION Other reaction(s): HALLUCINATIONS Other reaction(s): HALLUCINATIONS Other reaction(s): HALLUCINATIONS Macrobid, sulfa  Other reaction(s): HALLUCINATIONS Other reaction(s): HALLUCINATIONS    Nitrofurantoin Swelling    Other reaction(s): SWELLING Other reaction(s): Swelling Other reaction(s): SWELLING    Sulfasalazine Swelling   Doxycycline Nausea Only    Other reaction(s): Nausea Only   Nsaids     Other reaction(s): COLON   Sulfa Antibiotics    Sulfamethoxazole Nausea And Vomiting    Other reaction(s): Nausea And Vomiting   Tolmetin Other (See Comments)    Other reaction(s): COLON Other reaction(s): COLON    Other Nausea Only    Sulfonamdes?   Oxymorphone Nausea And Vomiting    Other reaction(s): Nausea And Vomiting     Review of Systems: Negative except as noted in the HPI.  Objective:  [name] is a pleasant 73 y.o. female in NAD. AAO x 3.  Vascular Examination: Capillary refill time is 3-5 seconds to toes bilateral. Palpable pedal pulses b/l LE. Digital hair present b/l. No pedal edema b/l. Skin temperature gradient WNL b/l.  No varicosities b/l. No cyanosis or clubbing noted b/l.   Dermatological Examination: There is incurvation of the left hallux nail borders.  There is thickening and dystrophy of the left hallux toenail.  There is pain with compression of the entire nail.  There is an abrasion along the medial aspect of the right hallux nail where patient had worked on the nail margin herself.  No nail spicules are noted at this time.  Neurological Examination: Epicritic sensation is intact  Assessment/Plan: 1. Ingrown toenail   2. Dermatophytosis of nail    Discussed patient's condition today.  After obtaining patient consent, the left hallux was anesthetized with a 50:50 mixture of 1% lidocaine plain and 0.5% bupivacaine plain for a total of 3cc's administered.  Upon confirmation of anesthesia, a freer elevator was utilized to free the left hallux toenail from the nail bed.  The nail was then avulsed proximal to the eponychium and removed in toto.  The area was inspected for any remaining spicules.  A chemical matrixectomy was performed with NaOH and neutralized with acetic acid solution.  Antibiotic ointment and a DSD were applied, followed by a Coban dressing.  Patient tolerated the anesthetic and procedure well and will f/u in 2-3 weeks for recheck.  Patient given post-procedure instructions for daily 15-minute Epsom salt soaks, antibiotic ointment and daily use of Bandaids until toe starts to dry / form eschar.   Antibiotic ointment and a Band-Aid were applied to the right great toenail along the medial margin where the abrasion was located.  Patient instructed  to follow-up once the right hallux medial nail has started to grow back in so that we have something to "grab" when performing the partial nail avulsion.  Return in about 2 weeks (around 01/30/2024) for PNA recheck.   Clerance Lav, DPM, FACFAS [facility]     [address] [place], [place]  [address]                Office [phone]  Fax [phone]

## 2024-01-30 ENCOUNTER — Ambulatory Visit (INDEPENDENT_AMBULATORY_CARE_PROVIDER_SITE_OTHER): Payer: Medicare Other | Admitting: Podiatry

## 2024-01-30 DIAGNOSIS — B351 Tinea unguium: Secondary | ICD-10-CM

## 2024-01-30 DIAGNOSIS — L600 Ingrowing nail: Secondary | ICD-10-CM

## 2024-01-30 NOTE — Progress Notes (Signed)
       Subjective:  Patient ID: [name], female    DOB: [birth year],  MRN: [id number]  Chief Complaint  Patient presents with   Ingrown Toenail     We removed the left toe nail completely. It looks good, still soaking once a day. The right medail border has grown in some, but I do not know if it is enough to now remove the medial border. Both borders og the right great need to be removed. Not diabetic, no anti coag.    [name] presents to clinic today for f/u of PNA to the left hallux entire nail.  She also had an abrasion along the right hallux nail margin and just need to make sure this looks okay.  She is wondering if the medial border of the right hallux nail has grown out enough to be able to perform a PNA to this area.  PCP is Barbie Banner, MD.  Allergies  Allergen Reactions   Fluoxetine Swelling    Other reaction(s): HALLUCINATIONS Other reaction(s): Swelling Other reaction(s): HALLUCINATION Other reaction(s): HALLUCINATIONS Other reaction(s): HALLUCINATIONS Other reaction(s): HALLUCINATIONS Macrobid, sulfa  Other reaction(s): HALLUCINATIONS Other reaction(s): HALLUCINATIONS    Nitrofurantoin Swelling    Other reaction(s): SWELLING Other reaction(s): Swelling Other reaction(s): SWELLING    Sulfasalazine Swelling   Doxycycline Nausea Only    Other reaction(s): Nausea Only   Nsaids     Other reaction(s): COLON   Sulfa Antibiotics    Sulfamethoxazole Nausea And Vomiting    Other reaction(s): Nausea And Vomiting   Tolmetin Other (See Comments)    Other reaction(s): COLON Other reaction(s): COLON    Other Nausea Only    Sulfonamdes?   Oxymorphone Nausea And Vomiting    Other reaction(s): Nausea And Vomiting     Objective:  Vascular Examination: Capillary refill time is 3-5 seconds to toes bilateral. Palpable pedal pulses b/l LE. Digital hair present b/l. No pedal edema b/l. Skin temperature gradient WNL b/l. No varicosities b/l. No cyanosis or  clubbing noted b/l.   Dermatological Examination: Upon inspection of the PNA site, there are no clinical signs of infection.  No purulence, no necrosis, no malodor present.  Minimal to no erythema present.  Eschar forming along nail bed with some granular tissue remaining.  Minimal to no pain on palpation of area.  The right hallux nail along the medial border has a very tiny nail spicule growing at this time.  Informed the patient we will need to wait for additional growth to be able to perform a PNA on this border.  Assessment/Plan: 1. Ingrown toenail   2. Dermatophytosis of nail    Continue with antibiotic ointment and a light dressing to the left hallux nail bed.  Once the dry scab has formed she can discontinue this care.  Return in about 2 weeks (around 02/13/2024) for PNA R-1 nail.   Clerance Lav, DPM, FACFAS [facility]     [address] [place], [place] [address]                Office [phone]  Fax [phone]

## 2024-02-26 ENCOUNTER — Ambulatory Visit (INDEPENDENT_AMBULATORY_CARE_PROVIDER_SITE_OTHER): Admitting: Podiatry

## 2024-02-26 DIAGNOSIS — L03031 Cellulitis of right toe: Secondary | ICD-10-CM | POA: Diagnosis not present

## 2024-02-26 DIAGNOSIS — L600 Ingrowing nail: Secondary | ICD-10-CM | POA: Diagnosis not present

## 2024-02-26 MED ORDER — AMOXICILLIN-POT CLAVULANATE 875-125 MG PO TABS
1.0000 | ORAL_TABLET | Freq: Two times a day (BID) | ORAL | 0 refills | Status: DC
Start: 2024-02-26 — End: 2024-03-18

## 2024-02-26 NOTE — Progress Notes (Unsigned)
 Subjective:  Patient ID: [name], female    DOB: Oct 19, 1951,  MRN: [id number]  [name] presents to clinic today for:  Chief Complaint  Patient presents with   Ingrown Toenail    Right great nail, PNA for the lateral border and possible medial border. This toe is red and angry, she states that it will"bust open and bleed".  Left PNA check, it looksa little angry as well. There may be a piece of a nail left in the medial border.    Patient presents to have the right hallux medial and lateral nail borders permanently removed.  She stated that it was bothering her so much that she kept digging and digging at the skin and has cut some of the dorsal pulp of the toe away to try to get some relief.  However, the patient also notes that she has numbness in her feet.  There appears to be some cellulitis around the right hallux.  She had the left hallux nail permanently removed on a previous visit.  There is a small spicule that appears to be growing back on the medial side.  She also notes some pain in the right arch.  She will need to be rescheduled for this  PCP is Barbie Banner, MD.  Allergies  Allergen Reactions   Fluoxetine Swelling    Other reaction(s): HALLUCINATIONS Other reaction(s): Swelling Other reaction(s): HALLUCINATION Other reaction(s): HALLUCINATIONS Other reaction(s): HALLUCINATIONS Other reaction(s): HALLUCINATIONS Macrobid, sulfa  Other reaction(s): HALLUCINATIONS Other reaction(s): HALLUCINATIONS    Nitrofurantoin Swelling    Other reaction(s): SWELLING Other reaction(s): Swelling Other reaction(s): SWELLING    Sulfasalazine Swelling   Nsaids     Other reaction(s): COLON   Sulfa Antibiotics    Sulfamethoxazole Nausea And Vomiting    Other reaction(s): Nausea And Vomiting   Tolmetin Other (See Comments)    Other reaction(s): COLON Other reaction(s): COLON    Other Nausea Only    Sulfonamdes?   Oxymorphone Nausea And Vomiting    Other  reaction(s): Nausea And Vomiting     Review of Systems: Negative except as noted in the HPI.  Objective:  [name] is a pleasant 73 y.o. female in NAD. AAO x 3.  Vascular Examination: Capillary refill time is 3-5 seconds to toes bilateral. Palpable pedal pulses b/l LE. Digital hair present b/l. No pedal edema b/l. Skin temperature gradient WNL b/l. No varicosities b/l. No cyanosis or clubbing noted b/l.   Dermatological Examination: There is incurvation of the right hallux medial and lateral nail border.  There is pain on palpation of the affected nail border.  There is localized erythema and edema to the area.  No purulence is noted.  There is some raw skin present near the distal edge of the nail.  Assessment/Plan: 1. Ingrown toenail   2. Cellulitis of right toe     Meds ordered this encounter  Medications   amoxicillin-clavulanate (AUGMENTIN) 875-125 MG tablet    Sig: Take 1 tablet by mouth 2 (two) times daily.    Dispense:  20 tablet    Refill:  0   Discussed patient's condition today.  After obtaining patient consent, the right hallux was anesthetized with a 50:50 mixture of 1% lidocaine plain and 0.5% bupivacaine plain for a total of 3cc's administered.  Upon confirmation of anesthesia, a freer elevator was utilized to free the right hallux, medial and lateral nail border from the nail bed.  The nail borders were  then avulsed proximal to the eponychium and removed in toto.  The area was inspected for any remaining spicules.  A chemical matrixectomy was performed with NaOH and neutralized with acetic acid solution.  Antibiotic ointment and a DSD were applied, followed by a Coban dressing.  Patient tolerated the anesthetic and procedure well and will f/u in 2-3 weeks for recheck.  Patient given post-procedure instructions for daily 15-minute Epsom salt soaks, antibiotic ointment and daily use of Bandaids until toe starts to dry / form eschar.   A prescription for Augmentin 875  mg 1 tab p.o. twice daily x 7 days was sent to her pharmacy to treat the cellulitis.  Return in about 2 weeks (around 03/11/2024) for PNA recheck and check on right arch pain and also address the left hallux nail spicule.   Clerance Lav, DPM, FACFAS [facility]     [address] [place], [place] [address]                Office [phone]  Fax [phone]

## 2024-02-26 NOTE — Patient Instructions (Signed)
 Place 1/4 cup of epsom salts in a quart of warm tap water.  Submerge your foot or feet in the solution and soak for 20 minutes.  This soak should be done twice a day.  Next, remove your foot or feet from solution, blot dry the affected area. Apply ointment and cover if instructed by your doctor.   IF YOUR SKIN BECOMES IRRITATED WHILE USING THESE INSTRUCTIONS, IT IS OKAY TO SWITCH TO  WHITE VINEGAR AND WATER.  As another alternative soak, you may use antibacterial soap and water.  Dry the area well after soaking.  Apply a small amount of antibiotic ointment to the area, followed by a Bandaid or light gauze dressing.  As the area starts to dry out over the next few days, you can remove the covering at bedtime to encourage the toe to dry out more.  Once it forms a dry scab, you can discontinue these instructions.  Monitor for any signs/symptoms of infection. Call the office immediately if any occur or go directly to the emergency room. Call with any questions/concerns.

## 2024-02-28 ENCOUNTER — Ambulatory Visit: Admitting: Podiatry

## 2024-03-09 ENCOUNTER — Telehealth: Payer: Self-pay | Admitting: Urology

## 2024-03-09 NOTE — Telephone Encounter (Signed)
 Pt called saying that she finished her abx yesterday, when she took off the Band-Aid yesterday to soak pt stated that there was a black spot at the nail bed and she pressed it says it started bleeding and that part of the nail came off where the black spot was. Pt wants to know if she needs to go back on abx till she sees you on Wednesday 03/11/24.   Pharmacy - [id number] - [place], [place] - [address] [facility] [address]  Call back # [phone]

## 2024-03-11 ENCOUNTER — Ambulatory Visit (INDEPENDENT_AMBULATORY_CARE_PROVIDER_SITE_OTHER): Admitting: Podiatry

## 2024-03-11 DIAGNOSIS — L03032 Cellulitis of left toe: Secondary | ICD-10-CM | POA: Diagnosis not present

## 2024-03-11 DIAGNOSIS — L03031 Cellulitis of right toe: Secondary | ICD-10-CM | POA: Diagnosis not present

## 2024-03-11 MED ORDER — DOXYCYCLINE HYCLATE 100 MG PO CAPS
100.0000 mg | ORAL_CAPSULE | Freq: Two times a day (BID) | ORAL | 0 refills | Status: AC
Start: 2024-03-11 — End: 2024-03-21

## 2024-03-15 NOTE — Progress Notes (Signed)
       Subjective:  Patient ID: [name], female    DOB: June 24, 1951,  MRN: [id number]  Chief Complaint  Patient presents with   PNA Check    Checking the PNA of the 1st nails. Total nail removed for the left and the right lateral border. [place] toes are very pink/red. The left one looks angry.    IVEE SWANIGAN presents to clinic today for f/u of PNA to the bilateral hallux nails  PCP is Tura Gaines, MD.  Allergies  Allergen Reactions   Fluoxetine Swelling    Other reaction(s): HALLUCINATIONS Other reaction(s): Swelling Other reaction(s): HALLUCINATION Other reaction(s): HALLUCINATIONS Other reaction(s): HALLUCINATIONS Other reaction(s): HALLUCINATIONS Macrobid, sulfa  Other reaction(s): HALLUCINATIONS Other reaction(s): HALLUCINATIONS    Nitrofurantoin Swelling    Other reaction(s): SWELLING Other reaction(s): Swelling Other reaction(s): SWELLING    Sulfasalazine Swelling   Nsaids     Other reaction(s): COLON   Sulfa Antibiotics    Sulfamethoxazole Nausea And Vomiting    Other reaction(s): Nausea And Vomiting   Tolmetin Other (See Comments)    Other reaction(s): COLON Other reaction(s): COLON    Other Nausea Only    Sulfonamdes?   Oxymorphone Nausea And Vomiting    Other reaction(s): Nausea And Vomiting     Objective:  Vascular Examination: Capillary refill time is 3-5 seconds to toes bilateral. Palpable pedal pulses b/l LE. Digital hair present b/l. No pedal edema b/l. Skin temperature gradient WNL b/l. No varicosities b/l. No cyanosis or clubbing noted b/l.   Dermatological Examination: Upon inspection of the PNA site, there is moderate maceration around the nailbeds.  No purulence, no necrosis, no malodor present.  Localized erythema is present.  Eschar has not yet formed formed along nail margin.  There is mild pain on palpation of areas.   Assessment/Plan: 1. Ingrown toenail     Meds ordered this encounter  Medications   doxycycline   (VIBRAMYCIN ) 100 MG capsule    Sig: Take 1 capsule (100 mg total) by mouth 2 (two) times daily for 10 days.    Dispense:  20 capsule    Refill:  0   Prescription for doxycycline  100 mg 1 pill p.o. twice daily was sent to her pharmacy.  Patient instructed to use iodine or Iodosorb ointment to the area to help dry the area out.  It is too macerated and this is slowing the healing.  As she may leave the toes open to the air if she is resting at home with the feet elevated and no pets around that could lick the toes.  Cover with gauze if wearing closed toe shoes.  Return in about 1 week (around 03/18/2024) for PNA recheck.   Joe Murders, DPM, FACFAS [facility]     [address] [place], [place] [address]                Office [phone]  Fax [phone]

## 2024-03-18 ENCOUNTER — Ambulatory Visit (INDEPENDENT_AMBULATORY_CARE_PROVIDER_SITE_OTHER): Admitting: Podiatry

## 2024-03-18 ENCOUNTER — Encounter: Payer: Self-pay | Admitting: Podiatry

## 2024-03-18 DIAGNOSIS — L600 Ingrowing nail: Secondary | ICD-10-CM

## 2024-03-18 DIAGNOSIS — F909 Attention-deficit hyperactivity disorder, unspecified type: Secondary | ICD-10-CM | POA: Insufficient documentation

## 2024-03-18 DIAGNOSIS — I878 Other specified disorders of veins: Secondary | ICD-10-CM | POA: Insufficient documentation

## 2024-03-18 DIAGNOSIS — E271 Primary adrenocortical insufficiency: Secondary | ICD-10-CM | POA: Insufficient documentation

## 2024-03-18 NOTE — Progress Notes (Signed)
       Subjective:  Patient ID: [name], female    DOB: 1951-04-13,  MRN: [id number]  Chief Complaint  Patient presents with   PNA Check    Bilateral PNA check for Bilateral 1st toes.  Looks a little better. She stated she had something come out of the right one, and it has felt better. Using the iodasorb daily.    [name] presents to clinic today for f/u of PNA to the bilateral hallux.  As she notes minimal to no pain at this time.  She also noted that her medical history is not completely accurate and wanted to make sure we added Addison's disease, venous stasis, and ADHD to her problem list in epic for future reference.  She still has the small nail spicule on the left great toenail.  PCP is Tura Gaines, MD.  Allergies  Allergen Reactions   Fluoxetine Swelling    Other reaction(s): HALLUCINATIONS Other reaction(s): Swelling Other reaction(s): HALLUCINATION Other reaction(s): HALLUCINATIONS Other reaction(s): HALLUCINATIONS Other reaction(s): HALLUCINATIONS Macrobid, sulfa  Other reaction(s): HALLUCINATIONS Other reaction(s): HALLUCINATIONS    Nitrofurantoin Swelling    Other reaction(s): SWELLING Other reaction(s): Swelling Other reaction(s): SWELLING    Sulfasalazine Swelling   Nsaids     Other reaction(s): COLON   Sulfa Antibiotics    Sulfamethoxazole Nausea And Vomiting    Other reaction(s): Nausea And Vomiting   Tolmetin Other (See Comments)    Other reaction(s): COLON Other reaction(s): COLON    Other Nausea Only    Sulfonamdes?   Oxymorphone Nausea And Vomiting    Other reaction(s): Nausea And Vomiting     Objective:  Vascular Examination: Capillary refill time is 3-5 seconds to toes bilateral. Palpable pedal pulses b/l LE. Digital hair present b/l. No pedal edema b/l. Skin temperature gradient WNL b/l. No varicosities b/l. No cyanosis or clubbing noted b/l.   Dermatological Examination: Upon inspection of the PNA site, there are no  clinical signs of infection.  No purulence, no necrosis, no malodor present.  Minimal to no erythema present.  Eschar formed along the left hallux nail margin.  Minimal to no pain on palpation of area.  The right hallux still has some granular tissue present and no eschar formed yet.  Assessment/Plan: 1. Ingrown toenail     Iodosorb ointment was applied to the bilateral hallux nail avulsion sites today followed by a light dressing.  She was given a courtesy to above Iodosorb ointment and will apply this to the area once daily.  She may continue soaking in Epsom salt solution for another 3 to 4 days.  She was encouraged to let the toe air out during the day if she is not wearing closed toe shoes.  This will help the eschar form sooner.  In the future, at the patient's convenience, we can schedule to have the left hallux medial nail spicule permanently removed.  Return if symptoms worsen or fail to improve.   Joe Murders, DPM, FACFAS [facility]     [address] [place], [place] [address]                Office [phone]  Fax [phone]

## 2024-03-22 ENCOUNTER — Encounter: Payer: Self-pay | Admitting: Podiatry
